# Patient Record
Sex: Female | Born: 1985 | Race: White | Hispanic: No | Marital: Single | Smoking: Current every day smoker
Health system: Southern US, Community
[De-identification: ages and names within clinical notes are randomized; demographics above are authoritative.]

## PROBLEM LIST (undated history)

## (undated) DIAGNOSIS — F419 Anxiety disorder, unspecified: Secondary | ICD-10-CM

## (undated) DIAGNOSIS — R87629 Unspecified abnormal cytological findings in specimens from vagina: Secondary | ICD-10-CM

## (undated) DIAGNOSIS — F329 Major depressive disorder, single episode, unspecified: Secondary | ICD-10-CM

## (undated) DIAGNOSIS — F603 Borderline personality disorder: Secondary | ICD-10-CM

## (undated) DIAGNOSIS — F32A Depression, unspecified: Secondary | ICD-10-CM

## (undated) DIAGNOSIS — R569 Unspecified convulsions: Secondary | ICD-10-CM

## (undated) DIAGNOSIS — N189 Chronic kidney disease, unspecified: Secondary | ICD-10-CM

## (undated) DIAGNOSIS — F1011 Alcohol abuse, in remission: Secondary | ICD-10-CM

## (undated) HISTORY — PX: OTHER SURGICAL HISTORY: SHX169

---

## 2006-06-07 ENCOUNTER — Emergency Department (HOSPITAL_COMMUNITY): Admission: EM | Admit: 2006-06-07 | Discharge: 2006-06-07 | Payer: Self-pay | Admitting: Emergency Medicine

## 2007-03-29 ENCOUNTER — Emergency Department (HOSPITAL_COMMUNITY): Admission: EM | Admit: 2007-03-29 | Discharge: 2007-03-30 | Payer: Self-pay | Admitting: Emergency Medicine

## 2007-05-13 ENCOUNTER — Emergency Department (HOSPITAL_COMMUNITY): Admission: EM | Admit: 2007-05-13 | Discharge: 2007-05-13 | Payer: Self-pay | Admitting: Emergency Medicine

## 2007-05-14 ENCOUNTER — Emergency Department (HOSPITAL_COMMUNITY): Admission: EM | Admit: 2007-05-14 | Discharge: 2007-05-14 | Payer: Self-pay | Admitting: Emergency Medicine

## 2007-05-26 ENCOUNTER — Other Ambulatory Visit: Admission: RE | Admit: 2007-05-26 | Discharge: 2007-05-26 | Payer: Self-pay | Admitting: Unknown Physician Specialty

## 2007-05-26 ENCOUNTER — Encounter (INDEPENDENT_AMBULATORY_CARE_PROVIDER_SITE_OTHER): Payer: Self-pay | Admitting: Unknown Physician Specialty

## 2008-07-20 ENCOUNTER — Emergency Department (HOSPITAL_COMMUNITY): Admission: EM | Admit: 2008-07-20 | Discharge: 2008-07-20 | Payer: Self-pay | Admitting: Emergency Medicine

## 2009-06-03 ENCOUNTER — Emergency Department: Payer: Self-pay | Admitting: Emergency Medicine

## 2009-10-26 ENCOUNTER — Emergency Department (HOSPITAL_COMMUNITY): Admission: EM | Admit: 2009-10-26 | Discharge: 2009-10-26 | Payer: Self-pay | Admitting: Emergency Medicine

## 2010-11-26 IMAGING — CR DG HIP COMPLETE 2+V*R*
3 series · 3 of 3 positions shown · non-contrast
Comparison: None.

CLINICAL DATA: Fall.  Right hip and leg pain.

RIGHT HIP - COMPLETE 2+ VIEW

[t pelvis a.p.]
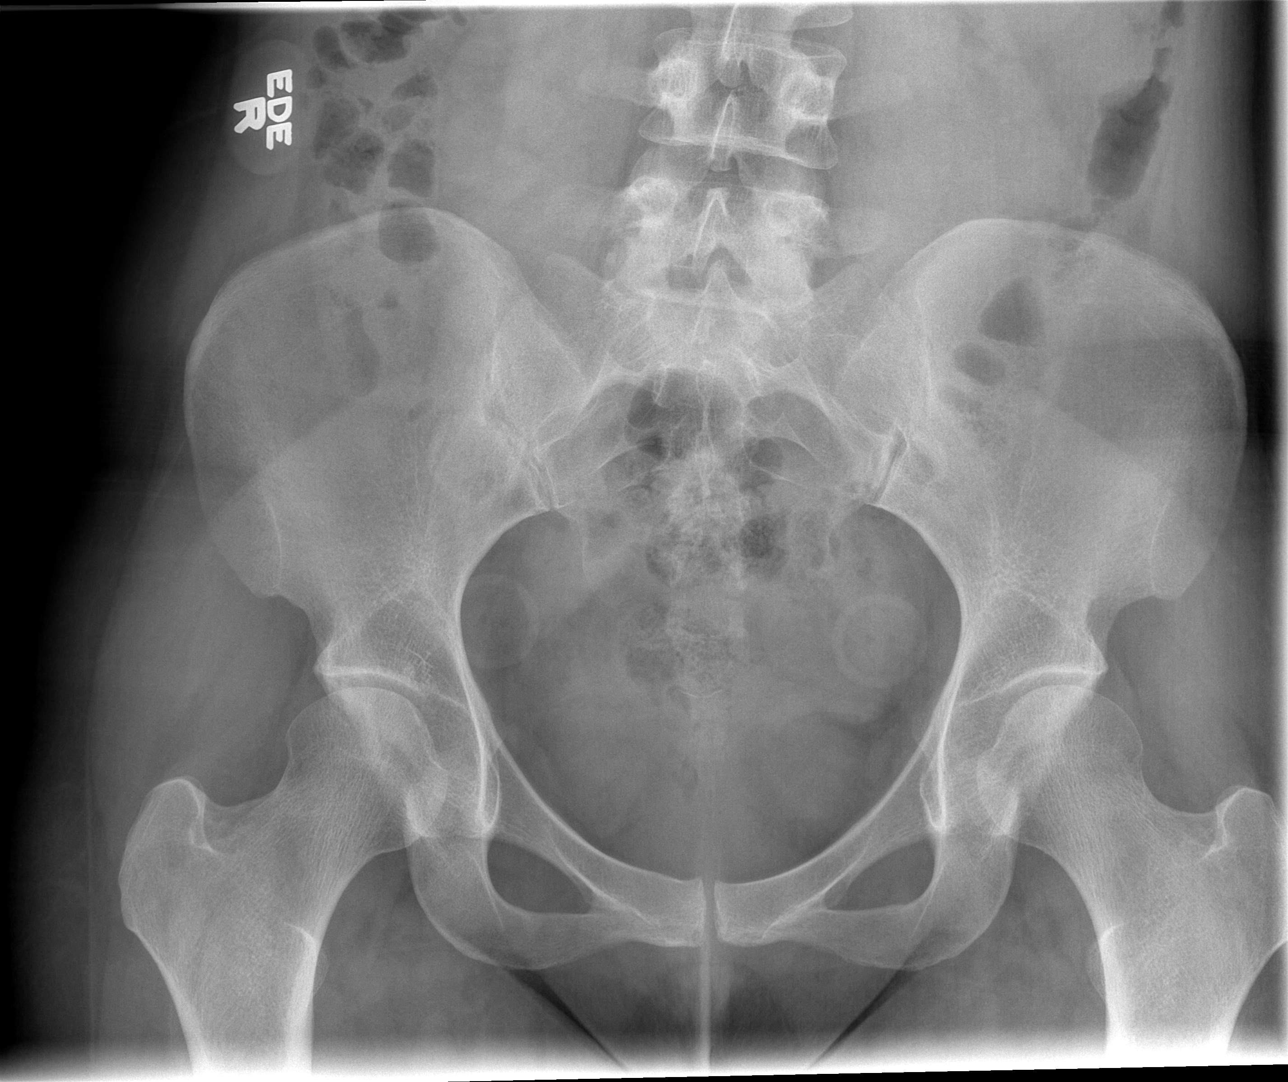

[t hip ap right]
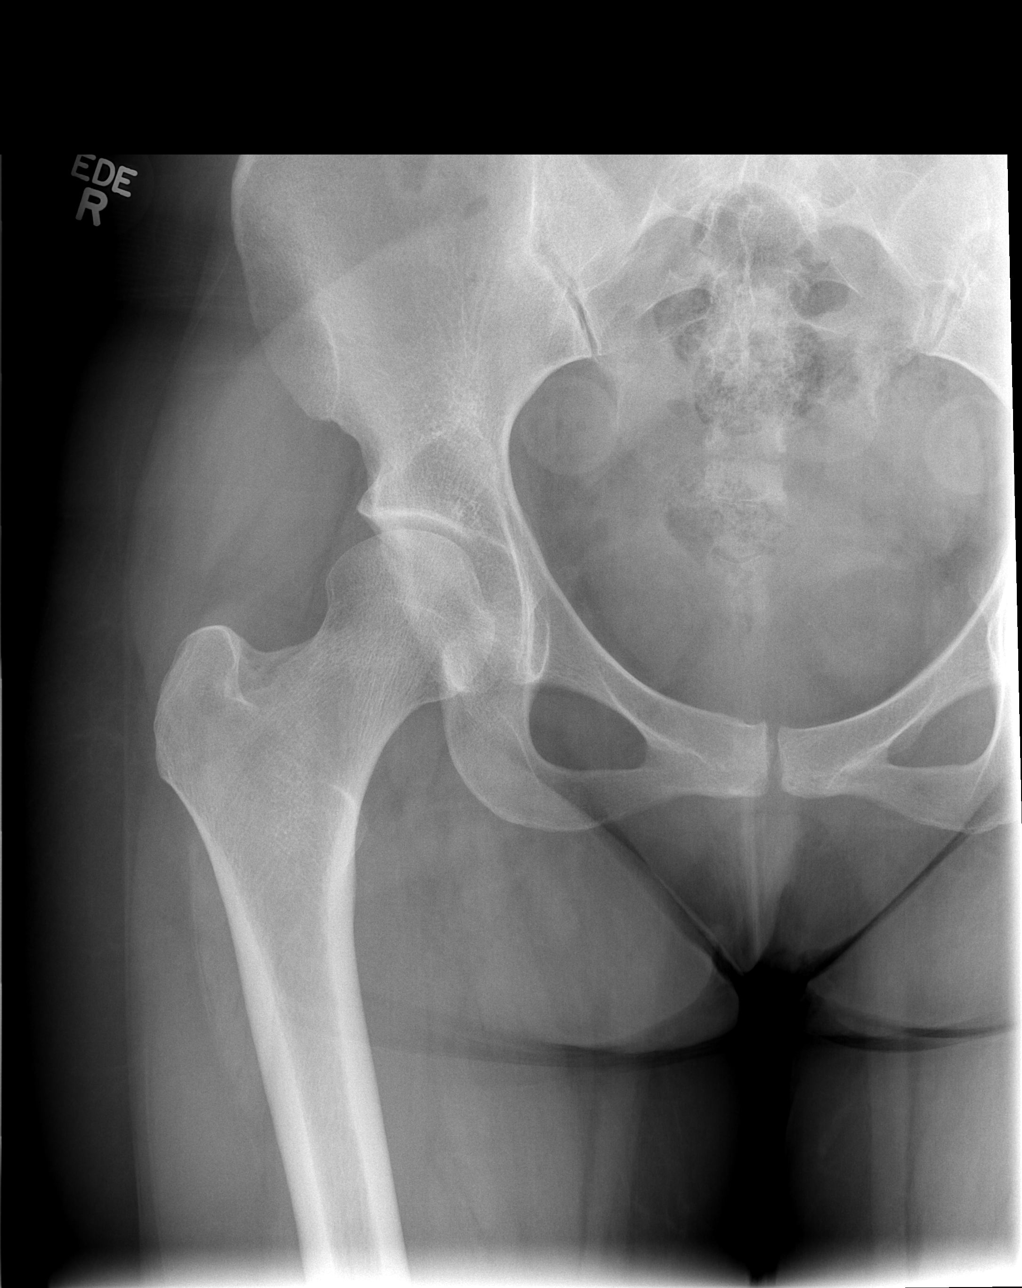

[t hip frog leg right]
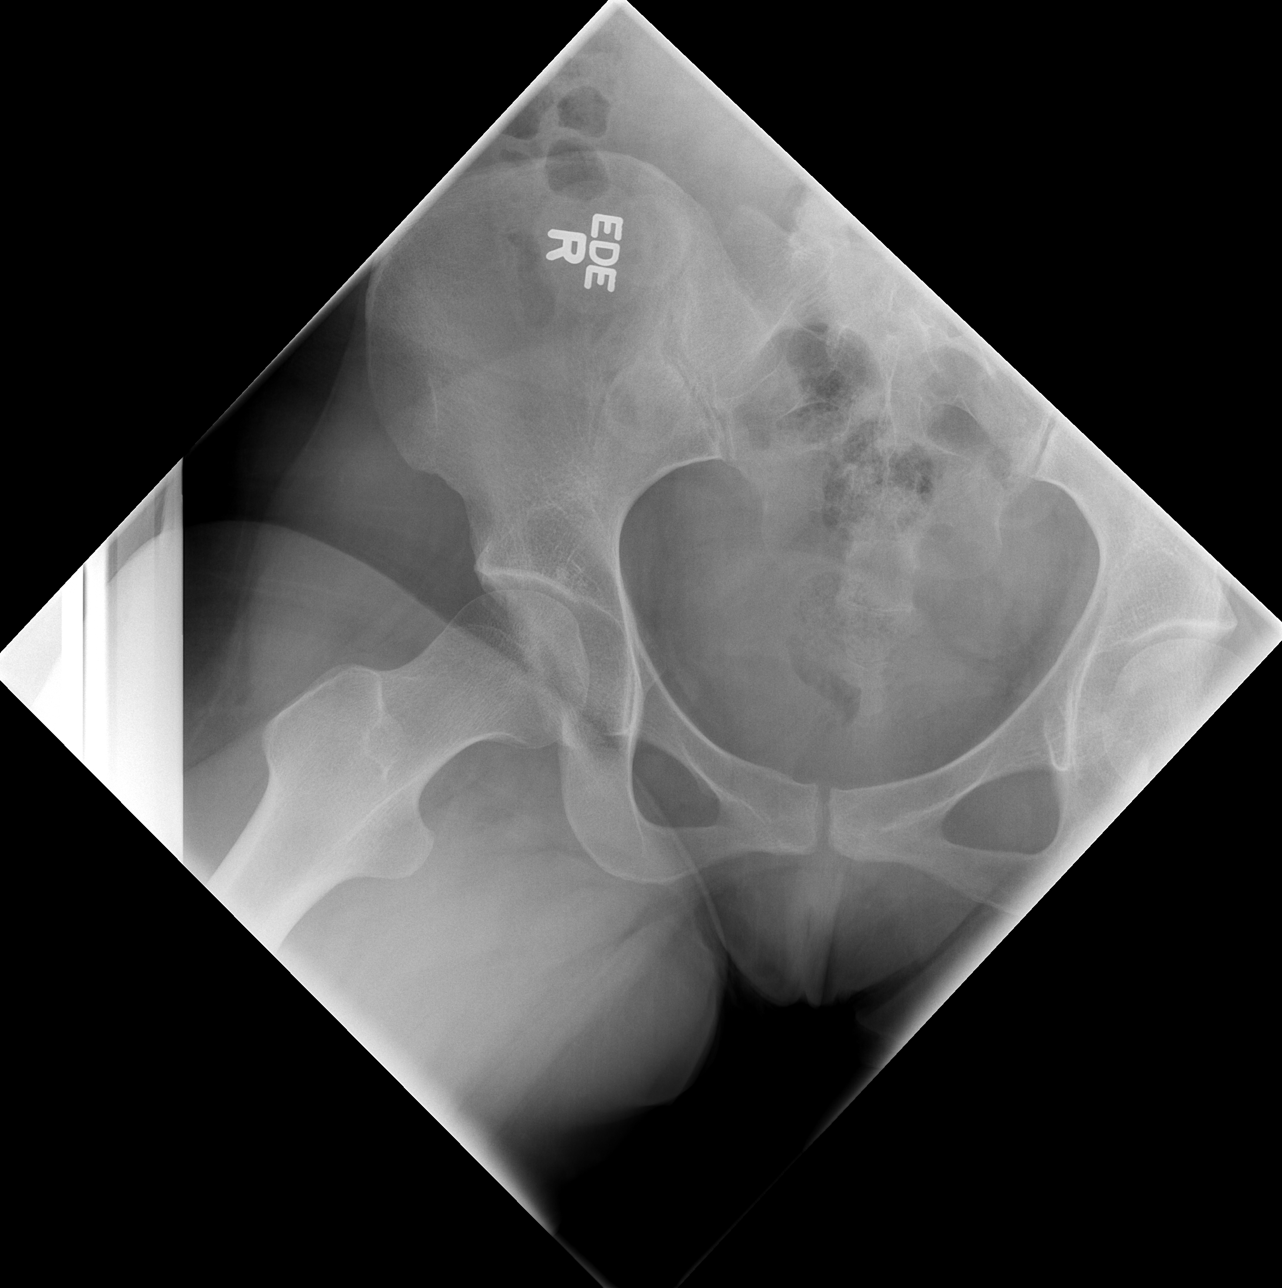

[3 of 3 positions shown; findings below may reference images not displayed]

FINDINGS: No fracture or dislocation.  Hip joint spaces are
preserved.
IMPRESSION: Negative right hip.

## 2011-05-16 LAB — DIFFERENTIAL
Basophils Absolute: 0
Lymphs Abs: 0.8
Monocytes Absolute: 1 — ABNORMAL HIGH
WBC Morphology: INCREASED

## 2011-05-16 LAB — CULTURE, BLOOD (ROUTINE X 2): Report Status: 10132008

## 2011-05-16 LAB — SALICYLATE LEVEL: Salicylate Lvl: <4

## 2011-05-16 LAB — CBC
HCT: 36
Hemoglobin: 12.5
MCHC: 34.7
Platelets: 196
RBC: 4.09
RDW: 12.4

## 2011-05-16 LAB — URINALYSIS, ROUTINE W REFLEX MICROSCOPIC
Bilirubin Urine: NEGATIVE
Glucose, UA: NEGATIVE
Ketones, ur: NEGATIVE
Nitrite: NEGATIVE
Protein, ur: 30 — AB
Specific Gravity, Urine: 1.01

## 2011-05-16 LAB — RAPID URINE DRUG SCREEN, HOSP PERFORMED
Barbiturates: NOT DETECTED
Benzodiazepines: NOT DETECTED

## 2011-05-16 LAB — BASIC METABOLIC PANEL
BUN: 10
Chloride: 104
Creatinine, Ser: 0.93
GFR calc Af Amer: >60
Sodium: 134 — ABNORMAL LOW

## 2011-05-16 LAB — URINE MICROSCOPIC-ADD ON

## 2011-05-17 LAB — COMPREHENSIVE METABOLIC PANEL
ALT: 14
AST: 19
Albumin: 4
CO2: 24
Calcium: 8.9
Creatinine, Ser: 0.78
GFR calc Af Amer: >60
Sodium: 137
Total Protein: 6.8

## 2011-05-17 LAB — CBC
HCT: 39.2
Hemoglobin: 13.6
MCV: 87.9
RBC: 4.46
WBC: 11.2 — ABNORMAL HIGH

## 2011-05-17 LAB — URINALYSIS, ROUTINE W REFLEX MICROSCOPIC
Bilirubin Urine: NEGATIVE
Ketones, ur: NEGATIVE
Specific Gravity, Urine: <1.005 — ABNORMAL LOW
pH: 7

## 2011-05-17 LAB — DIFFERENTIAL
Basophils Relative: 1
Eosinophils Absolute: 0.2
Eosinophils Relative: 2

## 2011-05-17 LAB — PREGNANCY, URINE: Preg Test, Ur: NEGATIVE

## 2013-06-15 ENCOUNTER — Encounter (HOSPITAL_COMMUNITY): Payer: Self-pay | Admitting: Emergency Medicine

## 2013-06-15 ENCOUNTER — Emergency Department (HOSPITAL_COMMUNITY)
Admission: EM | Admit: 2013-06-15 | Discharge: 2013-06-15 | Disposition: A | Discharge status: Home / Self Care | Payer: Self-pay | Attending: Emergency Medicine | Admitting: Emergency Medicine

## 2013-06-15 DIAGNOSIS — K529 Noninfective gastroenteritis and colitis, unspecified: Secondary | ICD-10-CM

## 2013-06-15 DIAGNOSIS — K5289 Other specified noninfective gastroenteritis and colitis: Secondary | ICD-10-CM | POA: Insufficient documentation

## 2013-06-15 DIAGNOSIS — Z3202 Encounter for pregnancy test, result negative: Secondary | ICD-10-CM | POA: Insufficient documentation

## 2013-06-15 LAB — URINALYSIS, ROUTINE W REFLEX MICROSCOPIC
Ketones, ur: NEGATIVE mg/dL
Leukocytes, UA: NEGATIVE
Urobilinogen, UA: 0.2 mg/dL (ref 0.0–1.0)

## 2013-06-15 LAB — URINE MICROSCOPIC-ADD ON

## 2013-06-15 LAB — PREGNANCY, URINE: Preg Test, Ur: NEGATIVE

## 2013-06-15 MED ORDER — ONDANSETRON HCL 4 MG/2ML IJ SOLN
4.0000 mg | Freq: Once | INTRAMUSCULAR | Status: AC
  Administered 2013-06-15: 4 mg via INTRAVENOUS
  Filled 2013-06-15: qty 2

## 2013-06-15 MED ORDER — PROMETHAZINE HCL 25 MG PO TABS: 25.0000 mg | ORAL_TABLET | Freq: Four times a day (QID) | ORAL | Status: AC | PRN

## 2013-06-15 MED ORDER — SODIUM CHLORIDE 0.9 % IV BOLUS (SEPSIS)
1000.0000 mL | Freq: Once | INTRAVENOUS | Status: AC
  Administered 2013-06-15: 1000 mL via INTRAVENOUS

## 2013-06-15 NOTE — Progress Notes (Signed)
ED/CM noted patient did not have health insurance and/or PCP listed in the computer.  Patient was given the [place] resource handout with information on the clinics, food pantries, and the handout for new health insurance sign-up.  Patient expressed appreciation for this. 

## 2013-06-15 NOTE — ED Provider Notes (Signed)
CSN: [id number]     Arrival date & time 06/15/13  0720 History  This chart was scribed for Donnetta Hutching, MD by Ronal Fear, ED Scribe. This patient was seen in room APA19/APA19 and the patient's care was started at 7:48 AM.    Chief Complaint  Patient presents with  . Emesis   (Consider location/radiation/quality/duration/timing/severity/associated sxs/prior Treatment) The history is provided by the patient. No language interpreter was used.  HPI Comments: Vanessa Wong is a 27 y.o. female who presents to the Emergency Department complaining of sudden onset mild n/v/d around 40 mins ago with constant abdominal cramping and associated trouble walking. She has had 1 episode of emesis. She states that there is no one else that is sick around her. Pt's boyfriend recalls the pt reheating a burrito that she had last night from [employer]. Pt denies dysuria.  She does not remember her LNMP. She denies pregnancy. Pt does not appear to be in any acute distress with no other complaints.    No past medical history on file. No past surgical history on file. No family history on file. History  Substance Use Topics  . Smoking status: Not on file  . Smokeless tobacco: Not on file  . Alcohol Use: Not on file   OB History   No data available     Review of Systems  Gastrointestinal: Positive for nausea, vomiting, abdominal pain and diarrhea.  Genitourinary: Negative for dysuria.  All other systems reviewed and are negative.    Allergies  Review of patient's allergies indicates not on file.  Home Medications  No current outpatient prescriptions on file. There were no vitals taken for this visit. Physical Exam  Nursing note and vitals reviewed. Constitutional: She is oriented to person, place, and time. She appears well-developed and well-nourished.  HENT:  Head: Normocephalic and atraumatic.  Eyes: Conjunctivae and EOM are normal. Pupils are equal, round, and reactive to light.  Neck:  Normal range of motion. Neck supple.  Cardiovascular: Normal rate, regular rhythm and normal heart sounds.   Pulmonary/Chest: Effort normal and breath sounds normal.  Abdominal: Soft. Bowel sounds are normal. There is tenderness.  Minimal suprapubic tenderness  Musculoskeletal: Normal range of motion.  Neurological: She is alert and oriented to person, place, and time.  Skin: Skin is warm and dry.  Psychiatric: She has a normal mood and affect.    ED Course  Procedures (including critical care time) DIAGNOSTIC STUDIES: Oxygen Saturation is 98% on RA, normal by my interpretation.    COORDINATION OF CARE:    7:52 AM- Pt advised of plan for treatment including IV fluids, antiemetics, pregnancy test, UAand watching her for an hour and explained multiple reasons as to why her symptoms occurred and pt agrees.  Labs Review Labs Reviewed  URINALYSIS, ROUTINE W REFLEX MICROSCOPIC - Abnormal; Notable for the following:    Specific Gravity, Urine >1.030 (*)    Hgb urine dipstick TRACE (*)    Protein, ur TRACE (*)    All other components within normal limits  URINE MICROSCOPIC-ADD ON - Abnormal; Notable for the following:    Squamous Epithelial / LPF MANY (*)    Bacteria, UA MANY (*)    All other components within normal limits  PREGNANCY, URINE   Imaging Review No results found.  EKG Interpretation   None       MDM  No diagnosis found. No acute abdomen. Patient feels better after IV fluids. Discharge medications Phenergan 25 mg  I  personally performed the services described in this documentation, which was scribed in my presence. The recorded information has been reviewed and is accurate.    Donnetta Hutching, MD 06/15/13 [phone]

## 2013-06-15 NOTE — ED Notes (Signed)
Pt c/o n/v/d abd pain, chills that started during the night,

## 2013-06-15 NOTE — ED Notes (Signed)
Pt sitting up in bed eating breakfast

## 2013-06-15 NOTE — ED Notes (Addendum)
Pt c/o n/v/d and abd cramping, and chills x .  Reports pain was initially severe.  Vomited x 1 and reports 1 episode of diarrhea PTA.  Denies any black or bloody stools.  EDP at bedside.

## 2014-09-17 ENCOUNTER — Encounter (HOSPITAL_COMMUNITY): Payer: Self-pay | Admitting: Emergency Medicine

## 2014-09-17 ENCOUNTER — Emergency Department (HOSPITAL_COMMUNITY)
Admission: EM | Admit: 2014-09-17 | Discharge: 2014-09-17 | Disposition: A | Discharge status: Home / Self Care | Payer: Self-pay | Attending: Emergency Medicine | Admitting: Emergency Medicine

## 2014-09-17 DIAGNOSIS — Y9389 Activity, other specified: Secondary | ICD-10-CM | POA: Insufficient documentation

## 2014-09-17 DIAGNOSIS — S299XXA Unspecified injury of thorax, initial encounter: Secondary | ICD-10-CM | POA: Insufficient documentation

## 2014-09-17 DIAGNOSIS — Z720 Tobacco use: Secondary | ICD-10-CM | POA: Insufficient documentation

## 2014-09-17 DIAGNOSIS — Y998 Other external cause status: Secondary | ICD-10-CM | POA: Insufficient documentation

## 2014-09-17 DIAGNOSIS — S199XXA Unspecified injury of neck, initial encounter: Secondary | ICD-10-CM | POA: Insufficient documentation

## 2014-09-17 DIAGNOSIS — R0789 Other chest pain: Secondary | ICD-10-CM

## 2014-09-17 DIAGNOSIS — Y92410 Unspecified street and highway as the place of occurrence of the external cause: Secondary | ICD-10-CM | POA: Insufficient documentation

## 2014-09-17 MED ORDER — CYCLOBENZAPRINE HCL 10 MG PO TABS: 10.0000 mg | ORAL_TABLET | Freq: Two times a day (BID) | ORAL | Status: AC | PRN

## 2014-09-17 MED ORDER — IBUPROFEN 800 MG PO TABS: 800.0000 mg | ORAL_TABLET | Freq: Three times a day (TID) | ORAL | Status: AC

## 2014-09-17 NOTE — Discharge Instructions (Signed)
Chest Wall Pain °Chest wall pain is pain in or around the bones and muscles of your chest. It may take up to 6 weeks to get better. It may take longer if you must stay physically active in your work and activities.  °CAUSES  °Chest wall pain may happen on its own. However, it may be caused by: °· A viral illness like the flu. °· Injury. °· Coughing. °· Exercise. °· Arthritis. °· Fibromyalgia. °· Shingles. °HOME CARE INSTRUCTIONS  °· Avoid overtiring physical activity. Try not to strain or perform activities that cause pain. This includes any activities using your chest or your abdominal and side muscles, especially if heavy weights are used. °· Put ice on the sore area. °· Put ice in a plastic bag. °· Place a towel between your skin and the bag. °· Leave the ice on for 15-20 minutes per hour while awake for the first 2 days. °· Only take over-the-counter or prescription medicines for pain, discomfort, or fever as directed by your caregiver. °SEEK IMMEDIATE MEDICAL CARE IF:  °· Your pain increases, or you are very uncomfortable. °· You have a fever. °· Your chest pain becomes worse. °· You have new, unexplained symptoms. °· You have nausea or vomiting. °· You feel sweaty or lightheaded. °· You have a cough with phlegm (sputum), or you cough up blood. °MAKE SURE YOU:  °· Understand these instructions. °· Will watch your condition. °· Will get help right away if you are not doing well or get worse. °Document Released: 07/22/2005 Document Revised: 10/14/2011 Document Reviewed: 03/18/2011 °ExitCare® Patient Information ©2015 [employer]. This information is not intended to replace advice given to you by your health care provider. Make sure you discuss any questions you have with your health care provider. °Cryotherapy °Cryotherapy means treatment with cold. Ice or gel packs can be used to reduce both pain and swelling. Ice is the most helpful within the first 24 to 48 hours after an injury or flare-up from overusing a  muscle or joint. Sprains, strains, spasms, burning pain, shooting pain, and aches can all be eased with ice. Ice can also be used when recovering from surgery. Ice is effective, has very few side effects, and is safe for most people to use. °PRECAUTIONS  °Ice is not a safe treatment option for people with: °· Raynaud phenomenon. This is a condition affecting small blood vessels in the extremities. Exposure to cold may cause your problems to return. °· Cold hypersensitivity. There are many forms of cold hypersensitivity, including: °¨ Cold urticaria. Red, itchy hives appear on the skin when the tissues begin to warm after being iced. °¨ Cold erythema. This is a red, itchy rash caused by exposure to cold. °¨ Cold hemoglobinuria. Red blood cells break down when the tissues begin to warm after being iced. The hemoglobin that carry oxygen are passed into the urine because they cannot combine with blood proteins fast enough. °· Numbness or altered sensitivity in the area being iced. °If you have any of the following conditions, do not use ice until you have discussed cryotherapy with your caregiver: °· Heart conditions, such as arrhythmia, angina, or chronic heart disease. °· High blood pressure. °· Healing wounds or open skin in the area being iced. °· Current infections. °· Rheumatoid arthritis. °· Poor circulation. °· Diabetes. °Ice slows the blood flow in the region it is applied. This is beneficial when trying to stop inflamed tissues from spreading irritating chemicals to surrounding tissues. However, if you expose   your skin to cold temperatures for too long or without the proper protection, you can damage your skin or nerves. Watch for signs of skin damage due to cold. °HOME CARE INSTRUCTIONS °Follow these tips to use ice and cold packs safely. °· Place a dry or damp towel between the ice and skin. A damp towel will cool the skin more quickly, so you may need to shorten the time that the ice is used. °· For a more  rapid response, add gentle compression to the ice. °· Ice for no more than 10 to 20 minutes at a time. The bonier the area you are icing, the less time it will take to get the benefits of ice. °· Check your skin after 5 minutes to make sure there are no signs of a poor response to cold or skin damage. °· Rest 20 minutes or more between uses. °· Once your skin is numb, you can end your treatment. You can test numbness by very lightly touching your skin. The touch should be so light that you do not see the skin dimple from the pressure of your fingertip. When using ice, most people will feel these normal sensations in this order: cold, burning, aching, and numbness. °· Do not use ice on someone who cannot communicate their responses to pain, such as small children or people with dementia. °HOW TO MAKE AN ICE PACK °Ice packs are the most common way to use ice therapy. Other methods include ice massage, ice baths, and cryosprays. Muscle creams that cause a cold, tingly feeling do not offer the same benefits that ice offers and should not be used as a substitute unless recommended by your caregiver. °To make an ice pack, do one of the following: °· Place crushed ice or a bag of frozen vegetables in a sealable plastic bag. Squeeze out the excess air. Place this bag inside another plastic bag. Slide the bag into a pillowcase or place a damp towel between your skin and the bag. °· Mix 3 parts water with 1 part rubbing alcohol. Freeze the mixture in a sealable plastic bag. When you remove the mixture from the freezer, it will be slushy. Squeeze out the excess air. Place this bag inside another plastic bag. Slide the bag into a pillowcase or place a damp towel between your skin and the bag. °SEEK MEDICAL CARE IF: °· You develop white spots on your skin. This may give the skin a blotchy (mottled) appearance. °· Your skin turns blue or pale. °· Your skin becomes waxy or hard. °· Your swelling gets worse. °MAKE SURE YOU:   °· Understand these instructions. °· Will watch your condition. °· Will get help right away if you are not doing well or get worse. °Document Released: 03/18/2011 Document Revised: 12/06/2013 Document Reviewed: 03/18/2011 °ExitCare® Patient Information ©2015 [employer]. This information is not intended to replace advice given to you by your health care provider. Make sure you discuss any questions you have with your health care provider. °Motor Vehicle Collision °It is common to have multiple bruises and sore muscles after a motor vehicle collision (MVC). These tend to feel worse for the first 24 hours. You may have the most stiffness and soreness over the first several hours. You may also feel worse when you wake up the first morning after your collision. After this point, you will usually begin to improve with each day. The speed of improvement often depends on the severity of the collision, the number of injuries,   and the location and nature of these injuries. °HOME CARE INSTRUCTIONS °· Put ice on the injured area. °¨ Put ice in a plastic bag. °¨ Place a towel between your skin and the bag. °¨ Leave the ice on for 15-20 minutes, 3-4 times a day, or as directed by your health care provider. °· Drink enough fluids to keep your urine clear or pale yellow. Do not drink alcohol. °· Take a warm shower or bath once or twice a day. This will increase blood flow to sore muscles. °· You may return to activities as directed by your caregiver. Be careful when lifting, as this may aggravate neck or back pain. °· Only take over-the-counter or prescription medicines for pain, discomfort, or fever as directed by your caregiver. Do not use aspirin. This may increase bruising and bleeding. °SEEK IMMEDIATE MEDICAL CARE IF: °· You have numbness, tingling, or weakness in the arms or legs. °· You develop severe headaches not relieved with medicine. °· You have severe neck pain, especially tenderness in the middle of the back of  your neck. °· You have changes in bowel or bladder control. °· There is increasing pain in any area of the body. °· You have shortness of breath, light-headedness, dizziness, or fainting. °· You have chest pain. °· You feel sick to your stomach (nauseous), throw up (vomit), or sweat. °· You have increasing abdominal discomfort. °· There is blood in your urine, stool, or vomit. °· You have pain in your shoulder (shoulder strap areas). °· You feel your symptoms are getting worse. °MAKE SURE YOU: °· Understand these instructions. °· Will watch your condition. °· Will get help right away if you are not doing well or get worse. °Document Released: 07/22/2005 Document Revised: 12/06/2013 Document Reviewed: 12/19/2010 °ExitCare® Patient Information ©2015 [employer]. This information is not intended to replace advice given to you by your health care provider. Make sure you discuss any questions you have with your health care provider. ° °
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
## 2016-01-20 ENCOUNTER — Encounter (HOSPITAL_COMMUNITY): Payer: Self-pay | Admitting: Emergency Medicine

## 2016-01-20 ENCOUNTER — Emergency Department (HOSPITAL_COMMUNITY)
Admission: EM | Admit: 2016-01-20 | Discharge: 2016-01-20 | Disposition: A | Discharge status: Home / Self Care | Payer: No Typology Code available for payment source | Attending: Emergency Medicine | Admitting: Emergency Medicine

## 2016-01-20 DIAGNOSIS — F17200 Nicotine dependence, unspecified, uncomplicated: Secondary | ICD-10-CM | POA: Insufficient documentation

## 2016-01-20 DIAGNOSIS — T7840XA Allergy, unspecified, initial encounter: Secondary | ICD-10-CM | POA: Insufficient documentation

## 2016-01-20 DIAGNOSIS — R21 Rash and other nonspecific skin eruption: Secondary | ICD-10-CM

## 2016-01-20 DIAGNOSIS — Z79899 Other long term (current) drug therapy: Secondary | ICD-10-CM | POA: Insufficient documentation

## 2016-01-20 MED ORDER — PENICILLIN V POTASSIUM 500 MG PO TABS: 500.0000 mg | ORAL_TABLET | Freq: Three times a day (TID) | ORAL | Status: AC

## 2016-01-20 NOTE — Discharge Instructions (Signed)
Drug Rash °A drug rash is a change in the color or texture of the skin that is caused by a drug. It can develop minutes, hours, or days after the person takes the drug. °CAUSES °This condition is usually caused by a drug allergy. It can also be caused by exposure to sunlight after taking a drug that makes the skin sensitive to light. Drugs that commonly cause rashes include: °· Penicillin. °· Antibiotic medicines. °· Medicines that treat seizures. °· Medicines that treat cancer (chemotherapy). °· Aspirin and other nonsteroidal anti-inflammatory drugs (NSAIDs). °· Injectable dyes that contain iodine. °· Insulin. °SYMPTOMS °Symptoms of this condition include: °· Redness. °· Tiny bumps. °· Peeling. °· Itching. °· Itchy welts (hives). °· Swelling. °The rash may appear on a small area of skin or all over the body. °DIAGNOSIS °To diagnose the condition, your health care provider will do a physical exam. He or she may also order tests to find out which drug caused the rash. Tests to find the cause of a rash include: °· Skin tests. °· Blood tests. °· Drug challenge. For this test, you stop taking all of the drugs that you do not need to take, and then you start taking them again by adding back one of the drugs at a time. °TREATMENT °A drug rash may be treated with medicines, including: °· Antihistamines. These may be given to relieve itching. °· An NSAID. This may be given to reduce swelling and treat pain. °· A steroid drug. This may be given to reduce swelling. °The rash usually goes away when the person stops taking the drug that caused it. °HOME CARE INSTRUCTIONS °· Take medicines only as directed by your health care provider. °· Let all of your health care providers know about any drug reactions you have had in the past. °· If you have hives, take a cool shower or use a cool compress to relieve itchiness. °SEEK MEDICAL CARE IF: °· You have a fever. °· Your rash is not going away. °· Your rash gets worse. °· Your rash  comes back. °· You have wheezing or coughing. °SEEK IMMEDIATE MEDICAL CARE IF: °· You start to have breathing problems. °· You start to have shortness of breath. °· You face or throat starts to swell. °· You have severe weakness with dizziness or fainting. °· You have chest pain. °  °This information is not intended to replace advice given to you by your health care provider. Make sure you discuss any questions you have with your health care provider. °  °Document Released: 08/29/2004 Document Revised: 08/12/2014 Document Reviewed: 05/18/2014 °Elsevier Interactive Patient Education ©2016 [employer] ° °

## 2016-01-20 NOTE — ED Notes (Signed)
Pt c/o red area to neck and upper chest, pt states she was seen by dentist 2 days ago for dental abscess, pt has been taking Cleocin since that time. Pt report generalized itching, GERD and dark brown/red vaginal discharge and itching started last night, pt denies odor.

## 2016-01-20 NOTE — ED Notes (Signed)
Patient here with complaints of red rash to neck. States that it appear around 6pm. Does not know if it is her perfume or the antibiotic (clindamycin) she's taking.

## 2016-01-20 NOTE — ED Provider Notes (Signed)
CSN: [id number]     Arrival date & time 01/20/16  0427 History   First MD Initiated Contact with Patient 01/20/16 0454     Chief Complaint  Patient presents with  . Rash     (Consider location/radiation/quality/duration/timing/severity/associated sxs/prior Treatment) HPI Comments: Patient presents with rash to anterior upper chest that started last night causing burning and itchiness. Since onset the itchiness has become generalized without spread of the rash. No SOB, lip/tongue swelling or throat tightness. She started Clindamycin for a dental infection 3 days ago and was concerned it was a reaction to the medication. She reports improvement to the facial swelling associated with the dental infection.   Patient is a 30 y.o. female presenting with rash. The history is provided by the patient. No language interpreter was used.  Rash Location:  Torso Torso rash location:  R chest and L chest Quality: burning, itchiness and redness   Severity:  Mild Onset quality:  Gradual Duration:  12 hours Associated symptoms: no fever, no myalgias, no nausea and no shortness of breath     History reviewed. No pertinent past medical history. Past Surgical History  Procedure Laterality Date  . Tooth removal     No family history on file. Social History  Substance Use Topics  . Smoking status: Current Every Day Smoker  . Smokeless tobacco: None  . Alcohol Use: Yes   OB History    No data available     Review of Systems  Constitutional: Negative for fever and chills.  HENT: Negative for trouble swallowing.   Respiratory: Negative.  Negative for shortness of breath.   Cardiovascular: Negative.   Gastrointestinal: Negative for nausea.  Musculoskeletal: Negative for myalgias.  Skin: Positive for color change and rash.  Neurological: Negative.       Allergies  Sulfa antibiotics  Home Medications   Prior to Admission medications   Medication Sig Start Date End Date Taking?  Authorizing Provider  cyclobenzaprine (FLEXERIL) 10 MG tablet Take 1 tablet (10 mg total) by mouth 2 (two) times daily as needed for muscle spasms. 09/17/14   Elpidio AnisShari Coleen Cardiff, PA-C  ibuprofen (ADVIL,MOTRIN) 800 MG tablet Take 1 tablet (800 mg total) by mouth 3 (three) times daily. 09/17/14   Elpidio AnisShari Landis Cassaro, PA-C  promethazine (PHENERGAN) 25 MG tablet Take 1 tablet (25 mg total) by mouth every 6 (six) hours as needed for nausea or vomiting. 06/15/13   Donnetta HutchingBrian Waldeck, MD   BP 117/83 mmHg  Pulse 67  Temp(Src) 98.2 F (36.8 C) (Oral)  Resp 20  Ht 5\' 5"  (1.651 m)  Wt 75.751 kg  BMI 27.79 kg/m2  SpO2 99% Physical Exam  Constitutional: She is oriented to person, place, and time. She appears well-developed and well-nourished.  HENT:  No facial swelling.  Neck: Normal range of motion.  Cardiovascular: Normal rate.   Pulmonary/Chest: Effort normal.  Neurological: She is alert and oriented to person, place, and time.  Skin: Skin is warm and dry. There is erythema.  Confluent, non-raised red rash to anterior chest wall. No other rash visualized.     ED Course  Procedures (including critical care time) Labs Review Labs Reviewed - No data to display  Imaging Review No results found. I have personally reviewed and evaluated these images and lab results as part of my medical decision-making.   EKG Interpretation None      MDM   Final diagnoses:  None    1. Rash  DDx includes allergic reaction to Clindamycin. Will switch  back to penicillin which is what she has taken in the past for dental infections. She is instructed to stop taking Clinda.     Elpidio Anis, PA-C 01/20/16 4098  Geoffery Lyons, MD 01/20/16 [phone]

## 2016-01-20 NOTE — ED Notes (Signed)
Patient d/c'd self care.  F/U and medications reviewed.  Patient verbalized understanding. 

## 2017-08-12 ENCOUNTER — Other Ambulatory Visit: Payer: Self-pay | Admitting: Obstetrics & Gynecology

## 2017-08-12 DIAGNOSIS — Z3682 Encounter for antenatal screening for nuchal translucency: Secondary | ICD-10-CM

## 2017-08-29 ENCOUNTER — Encounter (HOSPITAL_COMMUNITY): Payer: Self-pay | Admitting: Obstetrics & Gynecology

## 2017-09-04 ENCOUNTER — Encounter (HOSPITAL_COMMUNITY): Payer: Self-pay | Admitting: *Deleted

## 2017-09-08 ENCOUNTER — Ambulatory Visit (HOSPITAL_COMMUNITY): Payer: No Typology Code available for payment source

## 2017-09-08 ENCOUNTER — Ambulatory Visit (HOSPITAL_COMMUNITY)
Admission: RE | Admit: 2017-09-08 | Discharge: 2017-09-08 | Disposition: A | Discharge status: Home / Self Care | Payer: No Typology Code available for payment source | Source: Ambulatory Visit | Attending: Obstetrics & Gynecology | Admitting: Obstetrics & Gynecology

## 2017-09-08 ENCOUNTER — Encounter (HOSPITAL_COMMUNITY): Payer: Self-pay

## 2017-09-08 HISTORY — DX: Borderline personality disorder: F60.3

## 2017-09-08 HISTORY — DX: Chronic kidney disease, unspecified: N18.9

## 2017-09-08 HISTORY — DX: Alcohol abuse, in remission: F10.11

## 2017-09-08 HISTORY — DX: Major depressive disorder, single episode, unspecified: F32.9

## 2017-09-08 HISTORY — DX: Anxiety disorder, unspecified: F41.9

## 2017-09-08 HISTORY — DX: Depression, unspecified: F32.A

## 2017-09-08 HISTORY — DX: Unspecified convulsions: R56.9

## 2017-09-08 HISTORY — DX: Unspecified abnormal cytological findings in specimens from vagina: R87.629

## 2018-06-09 ENCOUNTER — Encounter (HOSPITAL_COMMUNITY): Payer: Self-pay
